# Patient Record
Sex: Male | Born: 1950 | Race: Black or African American | Hispanic: No
Health system: Southern US, Community
[De-identification: ages and names within clinical notes are randomized; demographics above are authoritative.]

---

## 2005-04-22 ENCOUNTER — Ambulatory Visit: Payer: Self-pay

## 2008-08-23 ENCOUNTER — Ambulatory Visit: Payer: Self-pay

## 2009-07-19 ENCOUNTER — Emergency Department: Payer: Self-pay | Admitting: Emergency Medicine

## 2010-01-25 ENCOUNTER — Inpatient Hospital Stay: Payer: Self-pay | Admitting: Internal Medicine

## 2010-08-14 ENCOUNTER — Ambulatory Visit: Payer: Self-pay | Admitting: Pain Medicine

## 2010-08-20 ENCOUNTER — Ambulatory Visit: Payer: Self-pay | Admitting: Pain Medicine

## 2010-08-29 ENCOUNTER — Ambulatory Visit: Payer: Self-pay | Admitting: Pain Medicine

## 2010-09-03 ENCOUNTER — Ambulatory Visit: Payer: Self-pay | Admitting: Family Medicine

## 2010-09-05 ENCOUNTER — Ambulatory Visit: Payer: Self-pay | Admitting: Pain Medicine

## 2010-10-02 ENCOUNTER — Ambulatory Visit: Payer: Self-pay | Admitting: Family Medicine

## 2010-10-18 ENCOUNTER — Ambulatory Visit: Payer: Self-pay | Admitting: Nephrology

## 2010-10-29 ENCOUNTER — Ambulatory Visit: Payer: Self-pay | Admitting: Family Medicine

## 2010-11-28 ENCOUNTER — Ambulatory Visit: Payer: Self-pay | Admitting: Family Medicine

## 2011-01-28 ENCOUNTER — Inpatient Hospital Stay: Payer: Self-pay | Admitting: Internal Medicine

## 2011-03-20 IMAGING — US US RENAL KIDNEY
1 series · 17 of 23 positions shown · non-contrast
Comparison: none

REASON FOR EXAM: CKD III
COMMENTS:

[Series 1: us renal kidney · 17 of 23 slices shown]
[im 1/23]
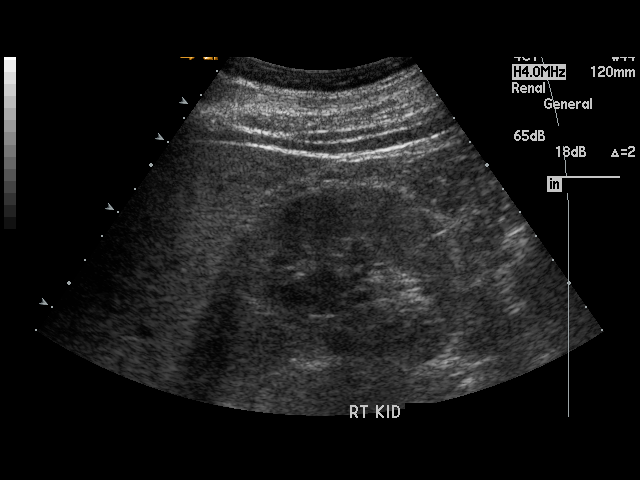
[im 3/23]
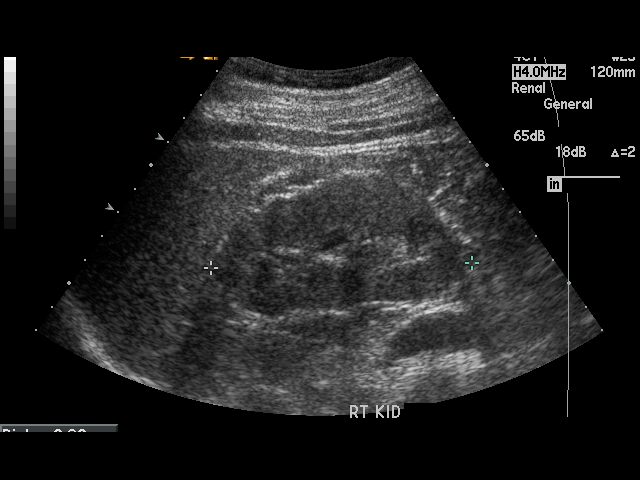
[im 4/23]
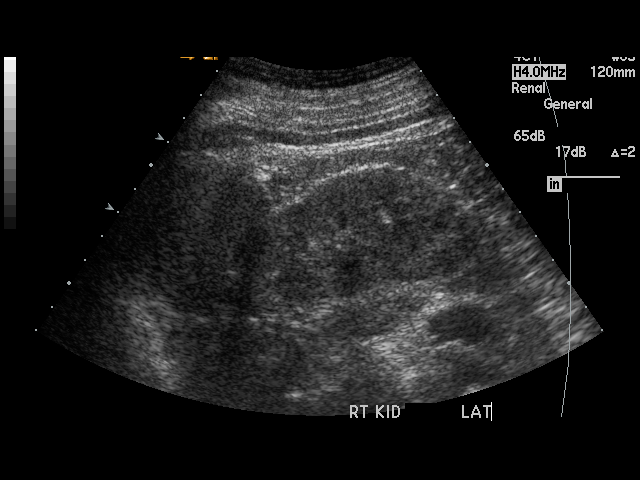
[im 5/23]
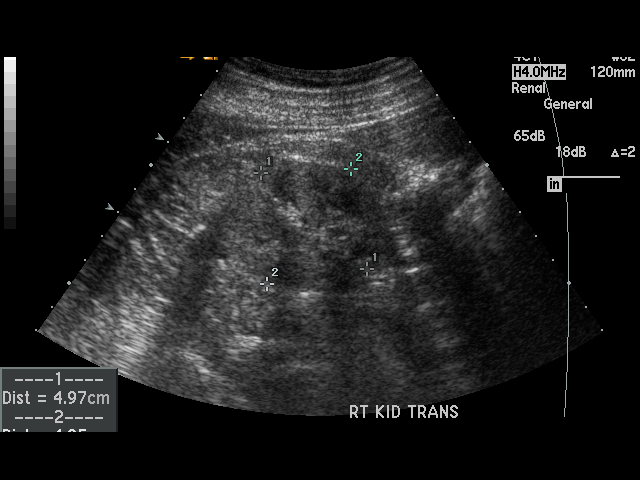
[im 7/23]
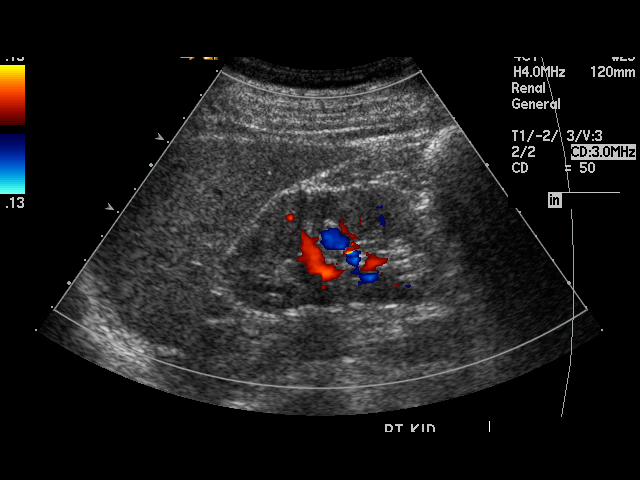
[im 8/23]
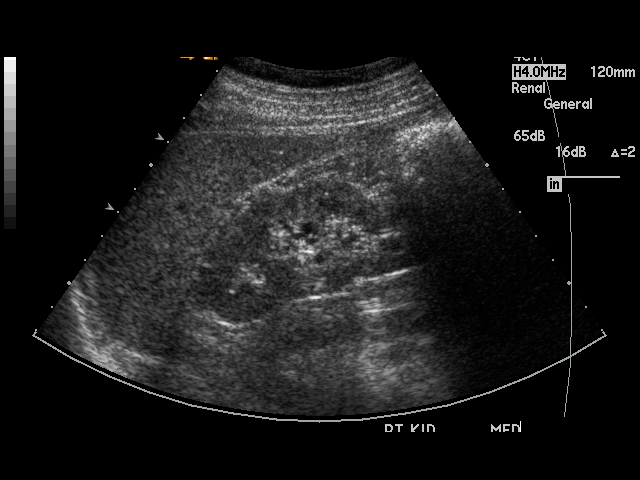
[im 9/23]
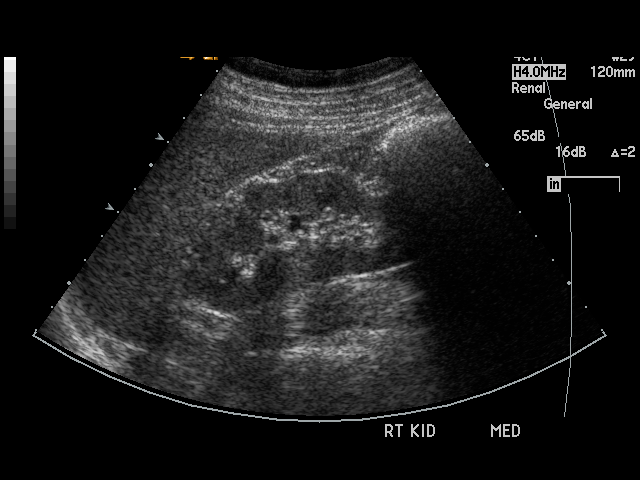
[im 11/23]
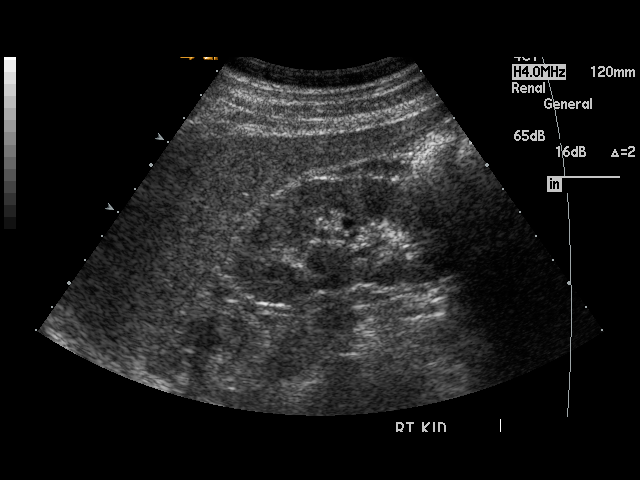
[im 12/23]
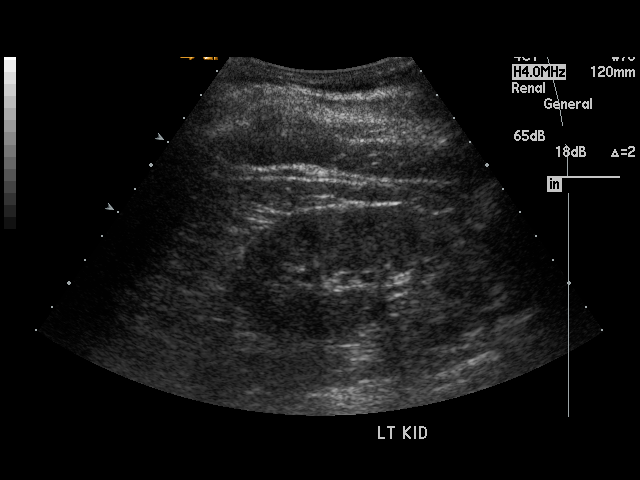
[im 13/23]
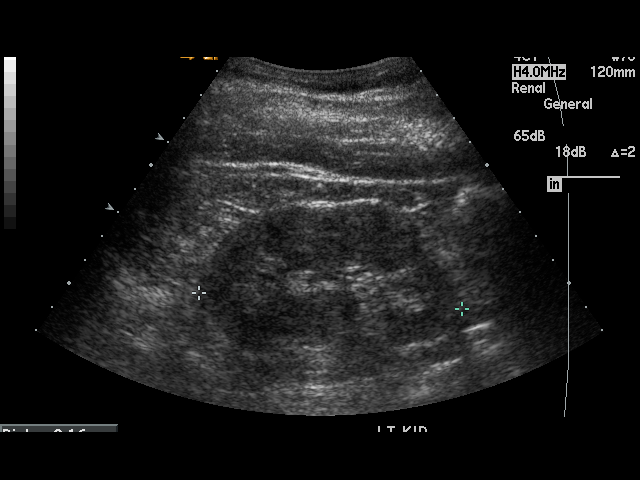
[im 15/23]
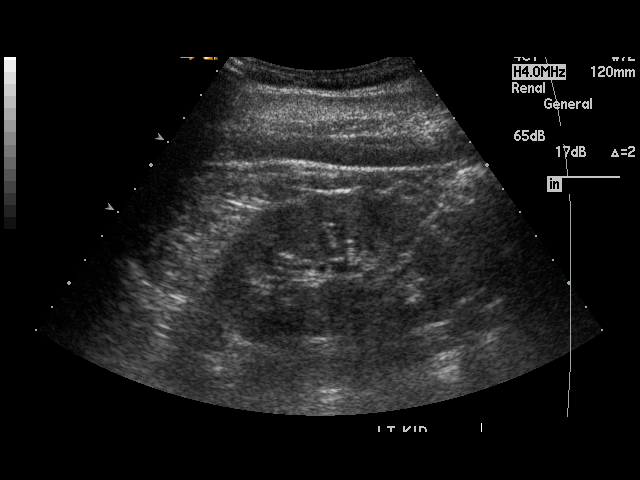
[im 16/23]
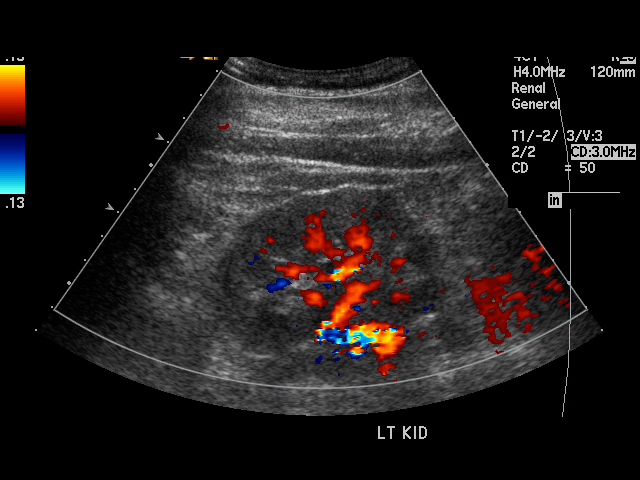
[im 17/23]
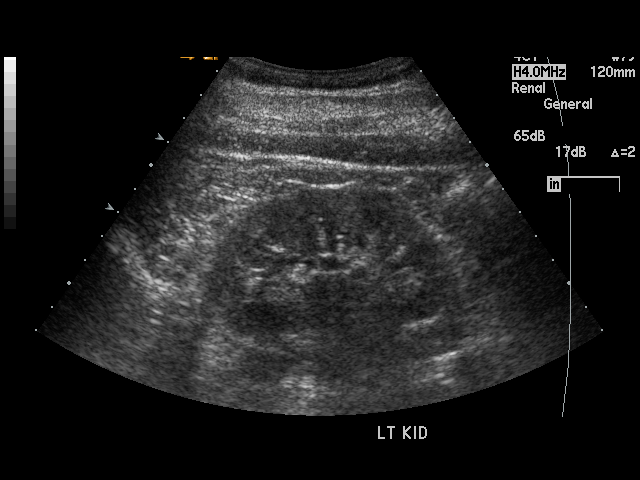
[im 19/23]
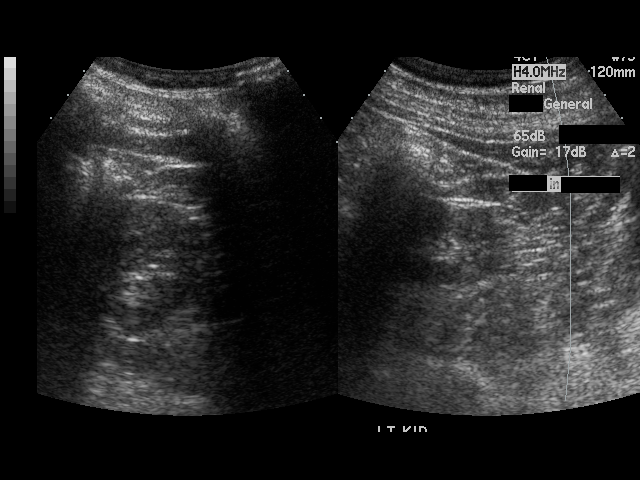
[im 20/23]
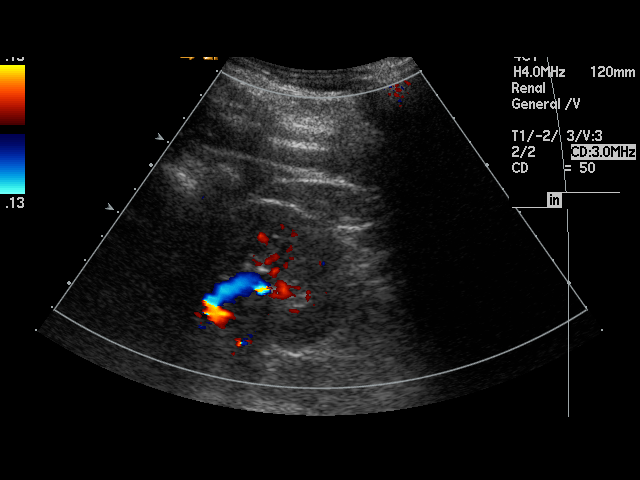
[im 21/23]
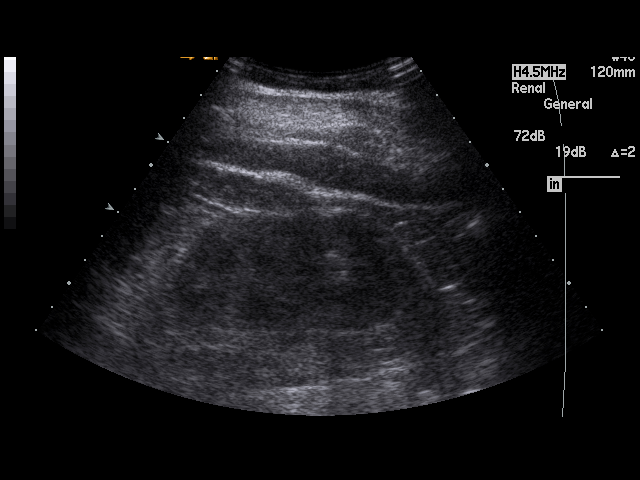
[im 23/23]
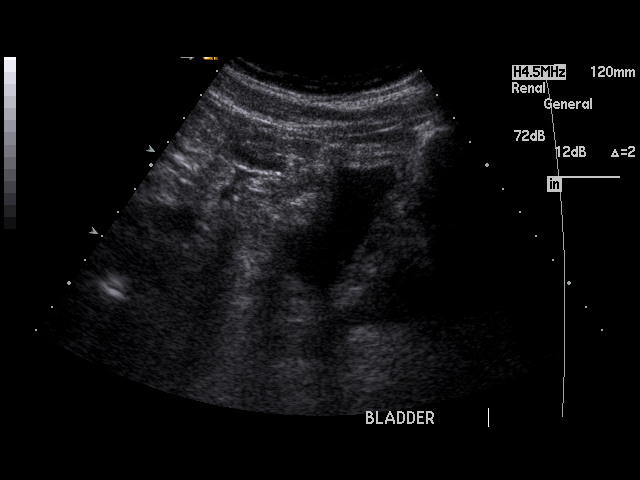

[17 of 23 positions shown; findings below may reference images not displayed]

PROCEDURE:     RONNETTE - RONNETTE KIDNEYS  - October 18, 2010  [DATE]

RESULT:     The right kidney measures 9.08 cm x 4.9 cm x 4.9 cm and the left
kidney measures 9.1 cm x 4.6 cm x 4.4 cm. The renal cortical margins
basically are smooth bilaterally. The kidneys bilaterally show increased
echogenicity consistent with a history of decreased renal function. No solid
or cystic renal mass lesions are seen. There is no hydronephrosis. No renal
calcifications are noted. The urinary bladder is nearly empty but the
visualized portion of the nearly empty bladder shows no significant
abnormalities.
IMPRESSION: 1. The kidneys show increased echogenicity consistent with a history of
diminished renal function.
2. No hydronephrosis or other acute change is identified.

## 2011-07-01 IMAGING — US US RENAL KIDNEY
1 series · 17 of 25 positions shown · non-contrast
Comparison: none

REASON FOR EXAM: renal failure, r/o obstruction
COMMENTS:

[Series 1: us renal kidney · 17 of 29 slices shown]
[im 1/29]
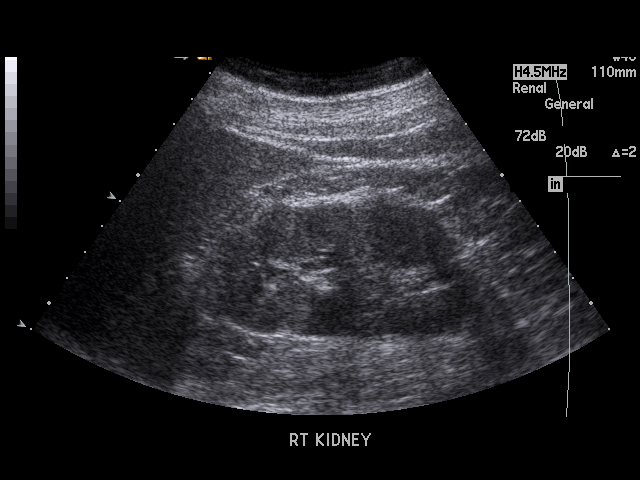
[im 3/29]
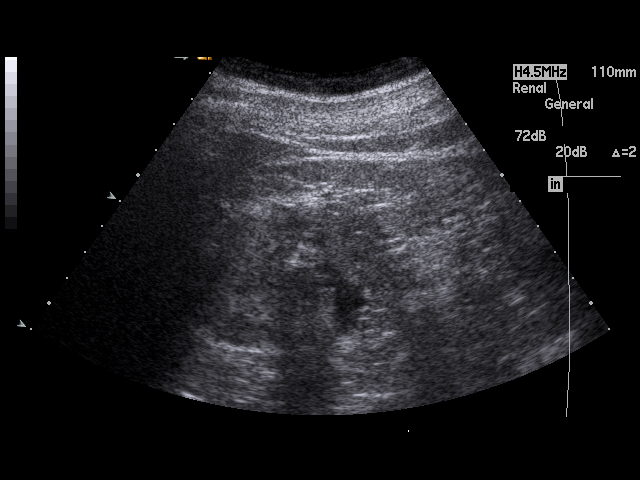
[im 4/29]
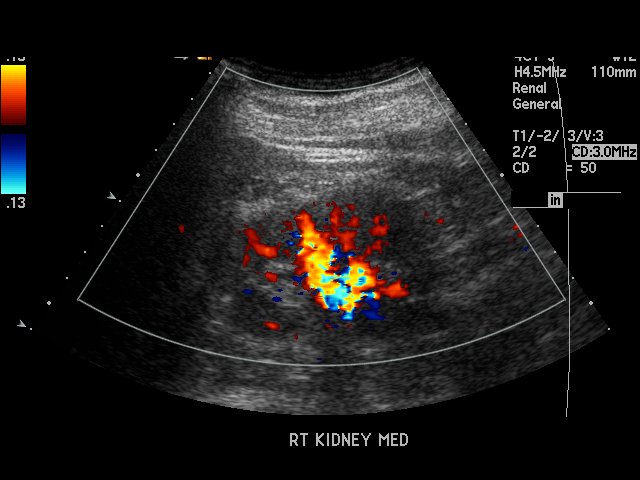
[im 6/29]
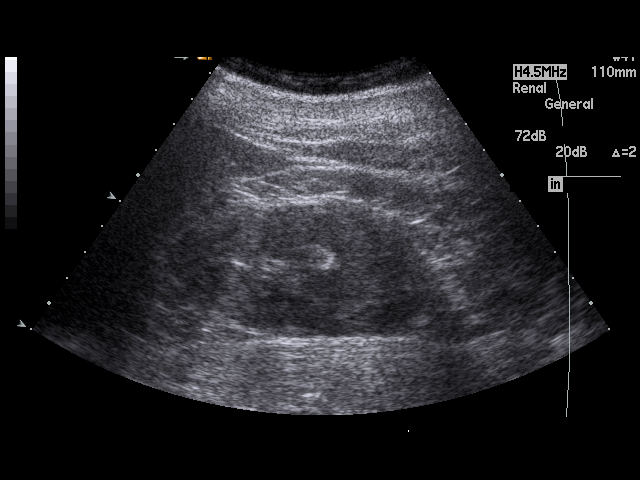
[im 8/29]
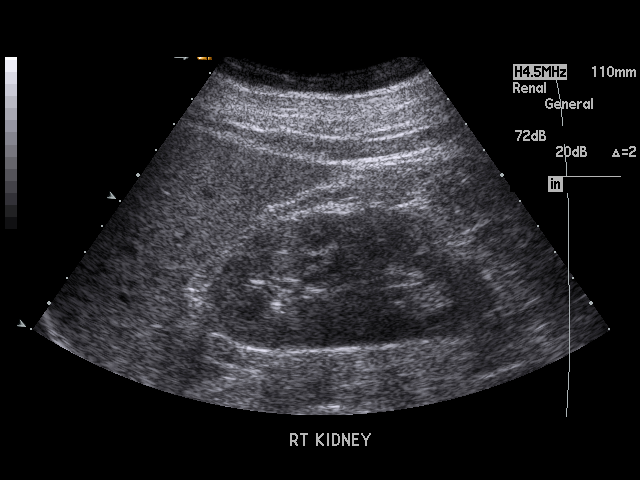
[im 10/29]
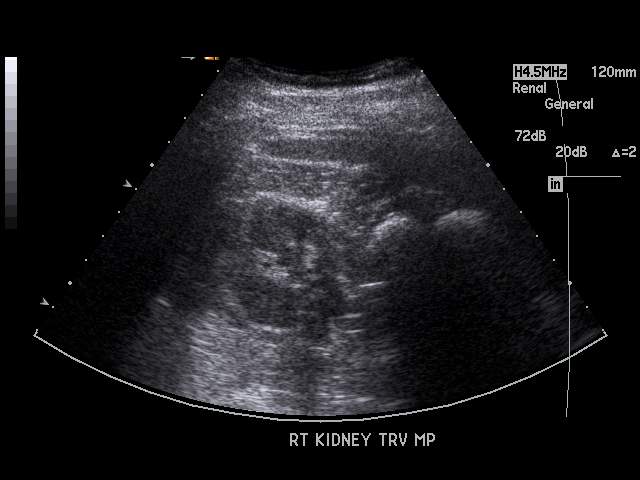
[im 11/29]
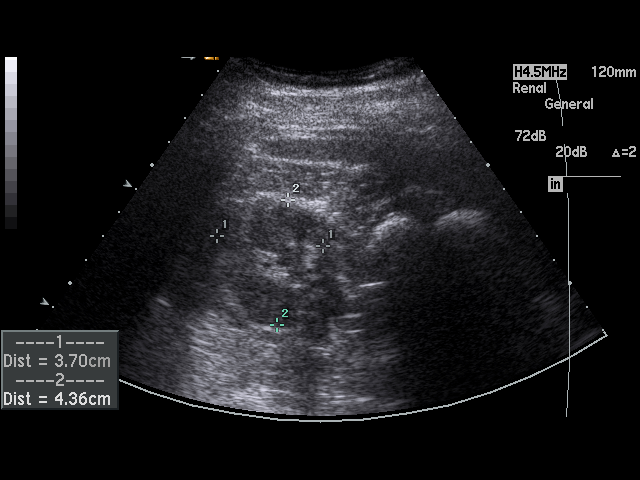
[im 13/29]
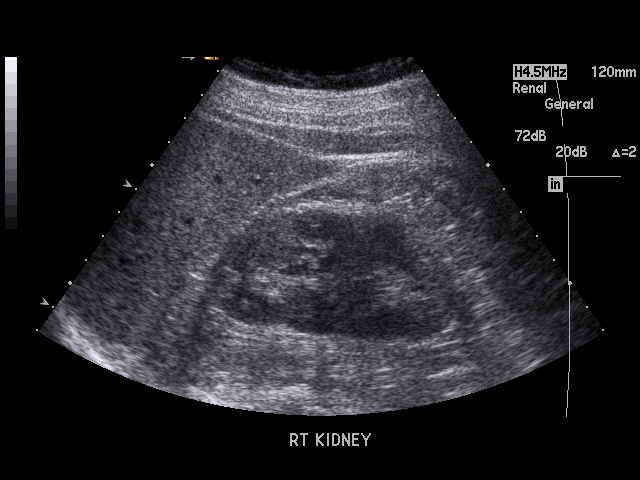
[im 15/29]
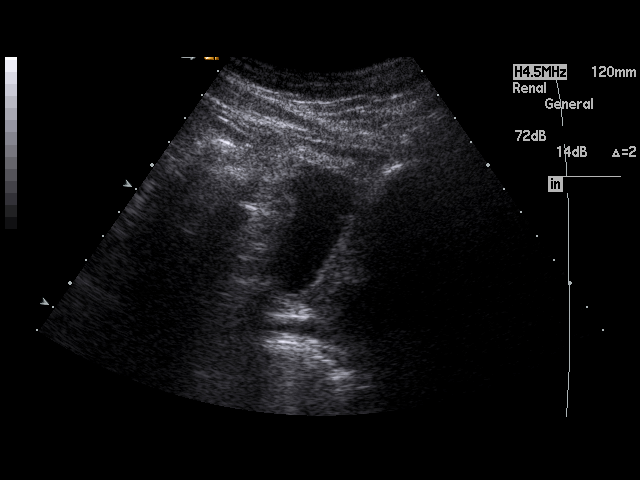
[im 16/29]
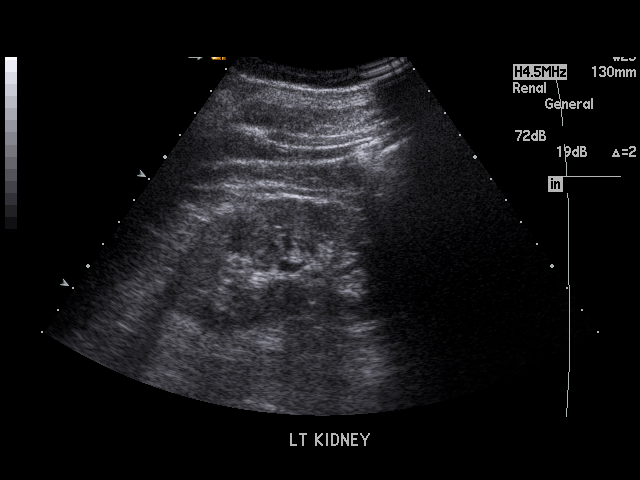
[im 18/29]
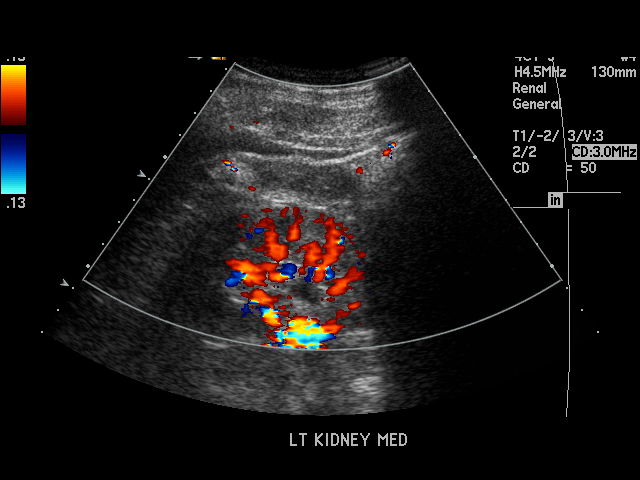
[im 19/29]
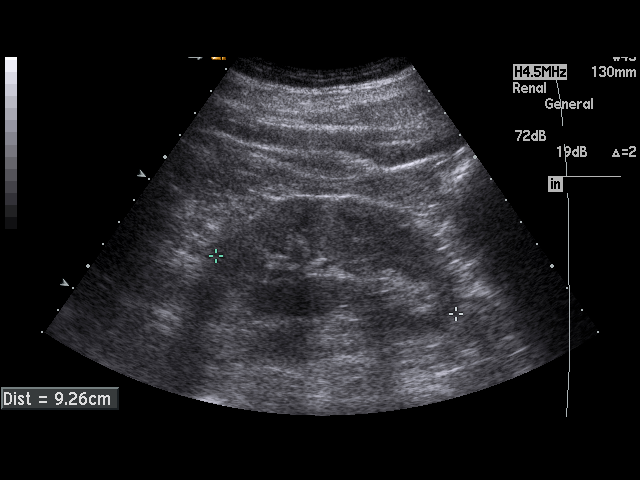
[im 22/29]
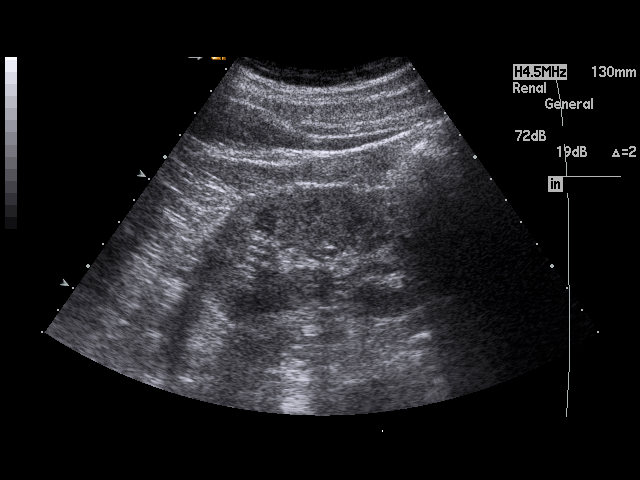
[im 23/29]
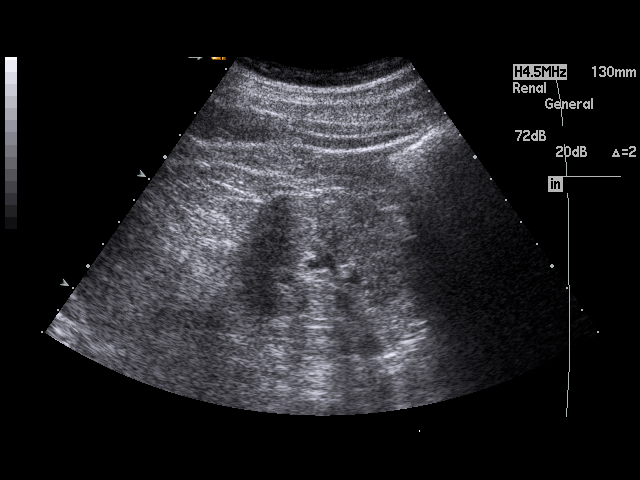
[im 25/29]
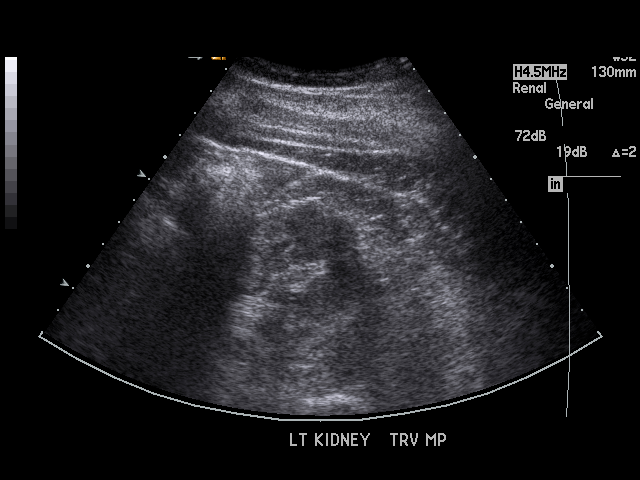
[im 26/29]
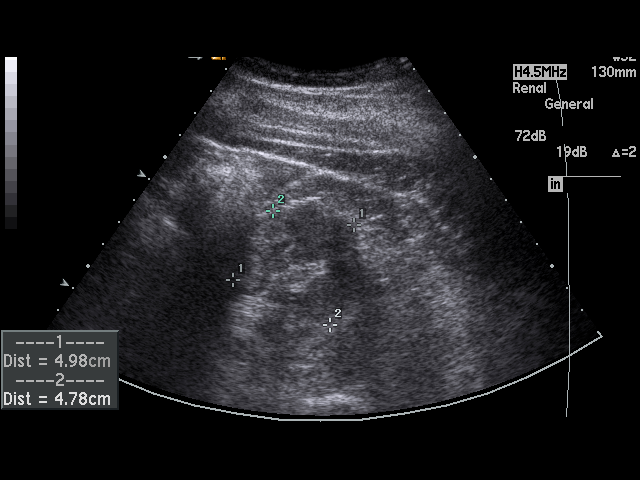
[im 29/29]
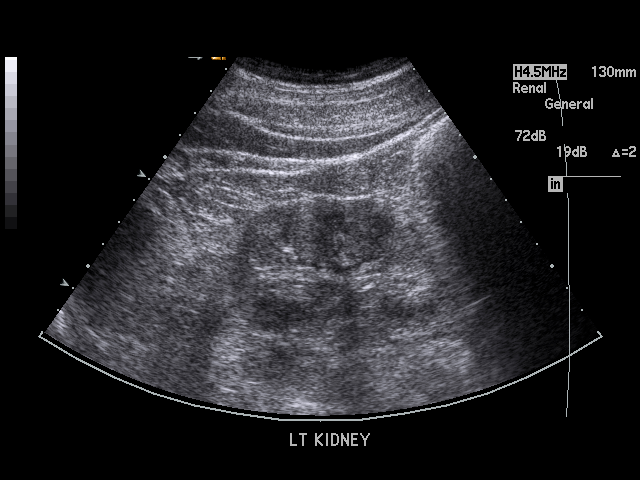

[17 of 25 positions shown; findings below may reference images not displayed]

PROCEDURE:     US  - US KIDNEY  - January 29, 2011  [DATE]

RESULT:     The right kidney measures 9.07 cm x 3.7 cm x 4.36 cm and the
left kidney measures 9.26 cm x 4.98 cm x 4.78 cm. The renal cortical margins
are smooth. The kidneys are mildly hyperechogenic bilaterally. No solid or
cystic renal mass lesions are seen. No renal calcifications are noted. There
is no hydronephrosis. The urinary bladder is not sufficiently filled for
evaluation.
IMPRESSION: 1. No hydronephrosis or other acute change is identified.
2. The kidneys again show increased echogenicity compatible with the
clinical history of diminished renal function.

## 2012-11-26 ENCOUNTER — Emergency Department: Payer: Self-pay | Admitting: Emergency Medicine

## 2014-12-01 LAB — CBC WITH DIFFERENTIAL/PLATELET
BASOS PCT: 0.4 %
Basophil #: 0 10*3/uL (ref 0.0–0.1)
Eosinophil #: 0.2 10*3/uL (ref 0.0–0.7)
Eosinophil %: 1.6 %
HCT: 39.4 % — AB (ref 40.0–52.0)
HGB: 12.6 g/dL — ABNORMAL LOW (ref 13.0–18.0)
LYMPHS ABS: 3.6 10*3/uL (ref 1.0–3.6)
LYMPHS PCT: 34.4 %
MCH: 31.3 pg (ref 26.0–34.0)
MCHC: 32 g/dL (ref 32.0–36.0)
MCV: 98 fL (ref 80–100)
Monocyte #: 0.7 x10 3/mm (ref 0.2–1.0)
Monocyte %: 6.9 %
NEUTROS ABS: 6 10*3/uL (ref 1.4–6.5)
Neutrophil %: 56.7 %
PLATELETS: 196 10*3/uL (ref 150–440)
RBC: 4.03 10*6/uL — ABNORMAL LOW (ref 4.40–5.90)
RDW: 13.5 % (ref 11.5–14.5)
WBC: 10.6 10*3/uL (ref 3.8–10.6)

## 2014-12-01 LAB — COMPREHENSIVE METABOLIC PANEL
ALBUMIN: 2.9 g/dL — AB (ref 3.4–5.0)
ALK PHOS: 119 U/L — AB
ANION GAP: 11 (ref 7–16)
AST: 56 U/L — AB (ref 15–37)
BILIRUBIN TOTAL: 0.4 mg/dL (ref 0.2–1.0)
BUN: 35 mg/dL — ABNORMAL HIGH (ref 7–18)
CHLORIDE: 109 mmol/L — AB (ref 98–107)
Calcium, Total: 8.6 mg/dL (ref 8.5–10.1)
Co2: 22 mmol/L (ref 21–32)
Creatinine: 3.45 mg/dL — ABNORMAL HIGH (ref 0.60–1.30)
EGFR (African American): 23 — ABNORMAL LOW
EGFR (Non-African Amer.): 19 — ABNORMAL LOW
Glucose: 145 mg/dL — ABNORMAL HIGH (ref 65–99)
OSMOLALITY: 294 (ref 275–301)
Potassium: 3.8 mmol/L (ref 3.5–5.1)
SGPT (ALT): 49 U/L
Sodium: 142 mmol/L (ref 136–145)
TOTAL PROTEIN: 7.4 g/dL (ref 6.4–8.2)

## 2014-12-01 LAB — PROTIME-INR
INR: 0.9
PROTHROMBIN TIME: 12.5 s (ref 11.5–14.7)

## 2014-12-01 LAB — URINALYSIS, COMPLETE
BACTERIA: NONE SEEN
Bilirubin,UR: NEGATIVE
GLUCOSE, UR: NEGATIVE mg/dL (ref 0–75)
KETONE: NEGATIVE
LEUKOCYTE ESTERASE: NEGATIVE
NITRITE: NEGATIVE
PH: 5 (ref 4.5–8.0)
Protein: 100
Specific Gravity: 1.009 (ref 1.003–1.030)
Squamous Epithelial: <1
WBC UR: 2 /HPF (ref 0–5)

## 2014-12-01 LAB — CK TOTAL AND CKMB (NOT AT ARMC)
CK, TOTAL: 265 U/L (ref 39–308)
CK-MB: 6 ng/mL — AB (ref 0.5–3.6)

## 2014-12-02 ENCOUNTER — Inpatient Hospital Stay: Payer: Self-pay | Admitting: Internal Medicine

## 2014-12-02 LAB — CK-MB
CK-MB: 6.5 ng/mL — ABNORMAL HIGH (ref 0.5–3.6)
CK-MB: 6.6 ng/mL — ABNORMAL HIGH (ref 0.5–3.6)

## 2014-12-02 LAB — TROPONIN I
Troponin-I: 0.06 ng/mL — ABNORMAL HIGH
Troponin-I: 0.17 ng/mL — ABNORMAL HIGH
Troponin-I: 0.11 ng/mL — ABNORMAL HIGH

## 2014-12-02 LAB — PRO B NATRIURETIC PEPTIDE: B-TYPE NATIURETIC PEPTID: 50322 pg/mL — AB (ref 0–125)

## 2014-12-03 LAB — LIPID PANEL
Cholesterol: 136 mg/dL (ref 0–200)
HDL Cholesterol: 52 mg/dL (ref 40–60)
Ldl Cholesterol, Calc: 64 mg/dL (ref 0–100)
Triglycerides: 101 mg/dL (ref 0–200)
VLDL Cholesterol, Calc: 20 mg/dL (ref 5–40)

## 2014-12-03 LAB — BASIC METABOLIC PANEL
Anion Gap: 13 (ref 7–16)
BUN: 62 mg/dL — ABNORMAL HIGH (ref 7–18)
Calcium, Total: 8.4 mg/dL — ABNORMAL LOW (ref 8.5–10.1)
Chloride: 103 mmol/L (ref 98–107)
Co2: 22 mmol/L (ref 21–32)
Creatinine: 3.67 mg/dL — ABNORMAL HIGH (ref 0.60–1.30)
EGFR (African American): 22 — ABNORMAL LOW
EGFR (Non-African Amer.): 18 — ABNORMAL LOW
Glucose: 131 mg/dL — ABNORMAL HIGH (ref 65–99)
Osmolality: 295 (ref 275–301)
Potassium: 3.9 mmol/L (ref 3.5–5.1)
Sodium: 138 mmol/L (ref 136–145)

## 2014-12-03 LAB — HEMOGLOBIN A1C: Hemoglobin A1C: 6.4 % — ABNORMAL HIGH (ref 4.2–6.3)

## 2014-12-03 LAB — TSH: Thyroid Stimulating Horm: 0.337 u[IU]/mL — ABNORMAL LOW

## 2014-12-04 LAB — BASIC METABOLIC PANEL
Anion Gap: 10 (ref 7–16)
BUN: 63 mg/dL — AB (ref 7–18)
CHLORIDE: 107 mmol/L (ref 98–107)
Calcium, Total: 8.5 mg/dL (ref 8.5–10.1)
Co2: 23 mmol/L (ref 21–32)
Creatinine: 3.52 mg/dL — ABNORMAL HIGH (ref 0.60–1.30)
GFR CALC AF AMER: 23 — AB
GFR CALC NON AF AMER: 19 — AB
Glucose: 94 mg/dL (ref 65–99)
OSMOLALITY: 297 (ref 275–301)
POTASSIUM: 3.8 mmol/L (ref 3.5–5.1)
SODIUM: 140 mmol/L (ref 136–145)

## 2014-12-04 LAB — PLATELET COUNT: PLATELETS: 191 10*3/uL (ref 150–440)

## 2015-04-17 ENCOUNTER — Observation Stay
Admit: 2015-04-17 | Disposition: A | Discharge status: Home / Self Care | Payer: Self-pay | Attending: Internal Medicine | Admitting: Internal Medicine

## 2015-04-17 LAB — COMPREHENSIVE METABOLIC PANEL
ALT: 25 U/L
AST: 32 U/L
Albumin: 3.5 g/dL
Alkaline Phosphatase: 72 U/L
Anion Gap: 9 (ref 7–16)
BILIRUBIN TOTAL: 0.5 mg/dL
BUN: 46 mg/dL — ABNORMAL HIGH
CHLORIDE: 105 mmol/L
CO2: 27 mmol/L
CREATININE: 3.7 mg/dL — AB
Calcium, Total: 8.9 mg/dL
EGFR (African American): 19 — ABNORMAL LOW
GFR CALC NON AF AMER: 16 — AB
Glucose: 102 mg/dL — ABNORMAL HIGH
Potassium: 4.1 mmol/L
Sodium: 141 mmol/L
Total Protein: 7.2 g/dL

## 2015-04-17 LAB — CBC WITH DIFFERENTIAL/PLATELET
BASOS ABS: 0 10*3/uL (ref 0.0–0.1)
Basophil %: 0.6 %
EOS PCT: 1.7 %
Eosinophil #: 0.1 10*3/uL (ref 0.0–0.7)
HCT: 33 % — ABNORMAL LOW (ref 40.0–52.0)
HGB: 10.9 g/dL — ABNORMAL LOW (ref 13.0–18.0)
LYMPHS ABS: 1.5 10*3/uL (ref 1.0–3.6)
Lymphocyte %: 30.2 %
MCH: 30.8 pg (ref 26.0–34.0)
MCHC: 33 g/dL (ref 32.0–36.0)
MCV: 93 fL (ref 80–100)
MONO ABS: 0.5 x10 3/mm (ref 0.2–1.0)
MONOS PCT: 10.8 %
Neutrophil #: 2.8 10*3/uL (ref 1.4–6.5)
Neutrophil %: 56.7 %
PLATELETS: 191 10*3/uL (ref 150–440)
RBC: 3.54 10*6/uL — AB (ref 4.40–5.90)
RDW: 14.5 % (ref 11.5–14.5)
WBC: 5 10*3/uL (ref 3.8–10.6)

## 2015-04-17 LAB — IRON AND TIBC
IRON BIND. CAP.(TOTAL): 376 (ref 250–450)
IRON SATURATION: 12.8
IRON: 48 ug/dL
Unbound Iron-Bind.Cap.: 328.2

## 2015-04-17 LAB — HEMOGLOBIN A1C: Hemoglobin A1C: 5.9 %

## 2015-04-17 LAB — FERRITIN: FERRITIN (ARMC): 36 ng/mL

## 2015-04-18 LAB — CBC WITH DIFFERENTIAL/PLATELET
Basophil #: 0 10*3/uL (ref 0.0–0.1)
Basophil %: 0.6 %
Eosinophil #: 0.1 10*3/uL (ref 0.0–0.7)
Eosinophil %: 2.4 %
HCT: 30.1 % — AB (ref 40.0–52.0)
HGB: 10.1 g/dL — ABNORMAL LOW (ref 13.0–18.0)
LYMPHS PCT: 37.6 %
Lymphocyte #: 1.7 10*3/uL (ref 1.0–3.6)
MCH: 31 pg (ref 26.0–34.0)
MCHC: 33.5 g/dL (ref 32.0–36.0)
MCV: 93 fL (ref 80–100)
Monocyte #: 0.5 x10 3/mm (ref 0.2–1.0)
Monocyte %: 11.7 %
NEUTROS ABS: 2.2 10*3/uL (ref 1.4–6.5)
Neutrophil %: 47.7 %
Platelet: 180 10*3/uL (ref 150–440)
RBC: 3.25 10*6/uL — ABNORMAL LOW (ref 4.40–5.90)
RDW: 14.5 % (ref 11.5–14.5)
WBC: 4.5 10*3/uL (ref 3.8–10.6)

## 2015-04-18 LAB — BASIC METABOLIC PANEL
ANION GAP: 7 (ref 7–16)
BUN: 48 mg/dL — AB
Calcium, Total: 8.6 mg/dL — ABNORMAL LOW
Chloride: 108 mmol/L
Co2: 24 mmol/L
Creatinine: 3.54 mg/dL — ABNORMAL HIGH
EGFR (African American): 20 — ABNORMAL LOW
GFR CALC NON AF AMER: 17 — AB
GLUCOSE: 117 mg/dL — AB
Potassium: 3.9 mmol/L
Sodium: 139 mmol/L

## 2015-04-18 LAB — OCCULT BLOOD X 1 CARD TO LAB, STOOL: OCCULT BLOOD, FECES: NEGATIVE

## 2015-04-19 LAB — CBC WITH DIFFERENTIAL/PLATELET
Basophil #: 0 10*3/uL (ref 0.0–0.1)
Basophil %: 0.7 %
Eosinophil #: 0.1 10*3/uL (ref 0.0–0.7)
Eosinophil %: 2 %
HCT: 32.5 % — ABNORMAL LOW (ref 40.0–52.0)
HGB: 10.6 g/dL — ABNORMAL LOW (ref 13.0–18.0)
LYMPHS PCT: 26.4 %
Lymphocyte #: 1.2 10*3/uL (ref 1.0–3.6)
MCH: 30.3 pg (ref 26.0–34.0)
MCHC: 32.5 g/dL (ref 32.0–36.0)
MCV: 93 fL (ref 80–100)
MONO ABS: 0.4 x10 3/mm (ref 0.2–1.0)
Monocyte %: 9.6 %
Neutrophil #: 2.8 10*3/uL (ref 1.4–6.5)
Neutrophil %: 61.3 %
Platelet: 210 10*3/uL (ref 150–440)
RBC: 3.48 10*6/uL — ABNORMAL LOW (ref 4.40–5.90)
RDW: 14.8 % — AB (ref 11.5–14.5)
WBC: 4.6 10*3/uL (ref 3.8–10.6)

## 2015-04-21 NOTE — H&P (Signed)
PATIENT NAME:  Austin Haney, Austin Haney MR#:  [id number] DATE OF BIRTH:  01-31-1951  DATE OF ADMISSION:  12/02/2014  REFERRING PHYSICIAN: Enedina Finner. Manson Passey, MD  PRIMARY CARE PHYSICIAN: Silas Flood. Tejan-Sie, MD  ADMISSION DIAGNOSES:  Malignant hypertension and acute respiratory failure with hypoxia secondary to pulmonary edema.   HISTORY OF PRESENT ILLNESS: This is a 64 year old African American male who presents to the Emergency Department via EMS complaining of shortness of breath. The patient states that his breathing had been bad intermittently for a few weeks, but acutely worsened on the day of admission. He was not participating in any strenuous activity. He denied any sort of stabbing or gripping chest pain at the onset of his shortness of breath, but admits to some chest pain when taking deep breaths. The pain is substernal and radiates across his chest; it does not radiate down his arm or into his jaw. He has not had any nausea or vomiting, lightheadedness, or dizziness associated with this pain. He admits to a headache earlier today. He has a history of 2 pillow orthopnea at baseline, and he has not had to alter this since this acute episode. His labored breathing is what was most concerning to him.   In the Emergency Department, he was placed on BiPAP and was feeling much more comfortable. However, continued to have an incredibly elevated blood pressure with systolics more than 200, which prompted the Emergency Department to call for admission.   REVIEW OF SYSTEMS:  CONSTITUTIONAL: The patient denies fever or weakness.  EYES: Denies inflammation or blurred vision.  EARS, NOSE AND THROAT: Denies tinnitus or sore throat.  RESPIRATORY: Admits to shortness of breath and some occasional coughing, as well as dyspnea on exertion.  CARDIOVASCULAR: Admits to chest pain that is associated with his deep breathing. He denies palpitations. He admits to a chronic history of orthopnea.  GASTROINTESTINAL: The  patient denies nausea, vomiting, abdominal pain, or diarrhea.  GENITOURINARY: Denies dysuria, increased frequency or hesitancy of urination.  ENDOCRINE: Denies polyuria or polydipsia.  INTEGUMENTARY: Denies rashes or lesions.  MUSCULOSKELETAL: Denies myalgias or arthralgias.  NEUROLOGIC: Denies numbness in his extremities or difficulty speaking.  PSYCHIATRIC: Denies depression or suicidal ideation.   PAST MEDICAL HISTORY: Hypertension, diabetes type 2, chronic back pain, chronic kidney disease, peptic ulcer disease, and gastroesophageal reflux disease.   PAST SURGICAL HISTORY: The patient had ORIF of his right arm many years ago.   SOCIAL HISTORY: The patient is a 64 pack-year smoker. He quit drinking alcohol 20 years ago, but he admits to occasional marijuana use.   FAMILY HISTORY: Significant for hypertension in his father.   MEDICATIONS:  1.  Aspirin 81 mg 1 tablet p.o. daily.  2.  Atenolol 100 mg 1 tablet p.o. b.i.d.  3.  Atorvastatin 20 mg 1 tablet p.o. at bedtime.  4.  Clonidine 0.3 mg 1 tablet p.o. t.i.d.  5.  Hydralazine 100 mg 1 tablet p.o. q.i.d.  6.  Labetalol 300 mg 1 tablet p.o. t.i.d.  7.  Lisinopril 40 mg 1 tablet p.o. daily.  8.  Multivitamin 1 tablet p.o. daily.  9.  Norco 10 mg/325 mg 1 tablet p.o. daily as needed.  10.  Omeprazole 20 mg 1 tablet p.o. daily.  11.  Tradjenta 5 mg 1 tablet p.o. daily.  12.  Verapamil 240 mg per 24-hour extended release capsule 1 capsule p.o. daily.   ALLERGIES: No known drug allergies.   PERTINENT LABORATORY RESULTS AND RADIOGRAPHIC FINDINGS: B-type natriuretic peptide is 50,322.  Serum glucose is 145, BUN is 35, creatinine is 3.45, serum sodium 142, potassium 3.8, chloride is 109, serum bicarbonate 22, calcium is 8.6. Serum albumin is 2.9, alkaline phosphatase 119, AST 66, ALT is 49. Initial troponin was 0.06. White blood cell count 10.6, hemoglobin is 12.6, hematocrit is 39.4, MCV is 98. INR 0.9. Urinalysis is negative for infection.  Chest x-ray shows patchy bilateral lower lobe opacities, suspicious for pneumonia. My read of his exam is that he appears to have some mild interstitial edema, but no areas of consolidation that would be consistent with pneumonia.   PHYSICAL EXAMINATION:  VITAL SIGNS: Temperature is 97.5, pulse 89, respirations 17, blood pressure originally 230/170; at the time of this dictation, 185/134.  Pulse oximetry is 92% on 2 L of oxygen via BiPAP.  GENERAL: The patient is alert and oriented, in no apparent distress, as he feels much more comfortable with the BiPAP on.  HEENT: Normocephalic, atraumatic. Pupils equal, round, and reactive to light and accommodation. Extraocular movements are intact. Mucous membranes are slightly tacky, now that he has BiPAP on his face.  NECK: Trachea is midline. No adenopathy.  CHEST: Symmetric and atraumatic.  CARDIOVASCULAR: Tachycardic rhythm. Normal S1 and S2. No rubs, clicks, or murmurs appreciated.  LUNGS: Clear to auscultation bilaterally. The patient is wearing BiPAP at this time.  ABDOMEN: Positive bowel sounds. Soft, nontender, nondistended. No hepatosplenomegaly.  GENITOURINARY: Deferred.  MUSCULOSKELETAL: The patient moves all 4 extremities equally. There is 5/5 strength in the upper and lower extremities bilaterally.  SKIN: No rashes or lesions.   EXTREMITIES: No clubbing, cyanosis, or edema.  NEUROLOGIC: Cranial nerves II through XII are grossly intact.  PSYCHIATRIC: Mood is normal. Affect is congruent.   ASSESSMENT AND PLAN: This is a [age] year old male admitted for malignant hypertension and acute respiratory failure with hypoxia secondary to pulmonary edema.  1.   Malignant hypertension. Systolic blood pressure more than 200 on arrival, and this persisted despite a nitro drip, labetalol and Lasix given in the Emergency Department. I have discontinued the nitroglycerin drip and started nicardipine. We will give the patient nighttime doses of his home  antihypertensive medicines, as he has not yet taken those today. We will titrate his drip as necessary, and continue to monitor his blood pressure. The patient also has nitro paste in place on his chest.  2.  Acute respiratory failure with hypoxia. This is secondary to pulmonary edema, likely due to uncontrolled blood pressure, causing an acute decline in his cardiac output. The patient was given Lasix in the Emergency Department, and placed on BiPAP. The Lasix appears to have helped, and we will discontinue the BiPAP when the patient is able to maintain his oxygen saturations without it.  3.  Hypertension. The patient is on all categories of antihypertensive medicines, except for renin inhibitors. We will continue his home regimen, which are at maximum doses of his medications. Our goal is to titrate his nicardipine drip for at least reduction of 20% of his maximum blood pressure.  4.  Diabetes type 2. Continue to Tradjenta. (There is no renal adjustment necessary for renal failure.) We will add sliding scale insulin as needed.  5.  Chronic kidney disease. The patient is currently in stage IV kidney failure. His baseline is unknown. We will avoid nephrotoxic agents as much as possible, and I have ordered a  nephrology consult to comment on his blood pressure and renal function.  6.  Peptic ulcer disease. We will continue his proton pump inhibitor.  7.  Gastroesophageal reflux disease,  as above.  8.  Chronic back pain; Norco per his home regimen.  9.  Deep vein thrombosis prophylaxis; heparin.  10.  Gastrointestinal prophylaxis, as above.   CODE STATUS: The patient is a Full Code.   TIME SPENT ON ADMISSION ORDERS AND PATIENT CARE: Approximately 45 minutes.    ____________________________ Kelton Pillar. Sheryle Hail, MD msd:MT D: 12/02/2014 08:43:05 ET T: 12/02/2014 09:29:03 ET JOB#: [id number]  cc: Kelton Pillar. Sheryle Hail, MD, <Dictator> Kelton Pillar Lashona Schaaf MD ELECTRONICALLY SIGNED 12/03/2014 4:52

## 2015-04-21 NOTE — Discharge Summary (Signed)
PATIENT NAME:  Austin Haney, Austin Haney MR#:  [id number] DATE OF BIRTH:  1951/06/18  DATE OF ADMISSION:  12/02/2014 DATE OF DISCHARGE:  12/04/2014  PRIMARY CARE PHYSICIAN: Marland McalpineSheikh A. Ellsworth Lennoxejan-Sie, MD  CONSULTATION: Cardiology, Dr. Welton FlakesKhan. Nephrology, Dr. Cherylann RatelLateef and Dr. Wynelle LinkKolluru.   DISCHARGE DIAGNOSES:  1. Malignant hypertension.  2. Acute respiratory failure with hypoxia secondary to pulmonary edema due to malignant hypertension.  3. Acute renal failure on chronic kidney disease.  4. Diabetes 2.   CONDITION: Stable.   CODE STATUS: Full code.   HOME MEDICATIONS: Please refer to the medication reconciliation list.   DIET: Low-sodium, low-fat, low-cholesterol, ADA diet.   ACTIVITY: As tolerated.   FOLLOW-UP CARE: Follow with PCP, Dr. Welton FlakesKhan and Dr. Cherylann RatelLateef within 1 to 2 weeks.   REASON FOR ADMISSION: Malignant hypertension, acute respiratory failure with hypoxia secondary to pulmonary edema, shortness of breath.   HOSPITAL COURSE: The patient is a 64 year old African American male with a history of hypertension, diabetes, CKD, presented to the ED with worsening shortness of breath with chest pain at the onset of his has shortness of breath. The patient has orthopnea at baseline. The patient was placed on BiPAP; however, the patient's blood pressure was elevated more than 200, so patient was admitted for hypertension.  For detailed history and physical examination, please refer to the admission note dictated by Dr. Sheryle Hailiamond.   Laboratory data on admission date showed BNP 50,322, BUN 35, creatinine 3.45. Electrolytes were normal, bicarbonate 22, WBC 10.6, hemoglobin 12.6. Urinalysis is negative. Chest x-ray showed patchy bilateral lower lobe opacity suspected for pneumonia, mild interstitial edema, but no consolidation.   For malignant hypertension, the patient was admitted to Critical Care Unit after start of nicardipine. In addition, the patient has been treated with home hypertension medication including  labetalol, atenolol, Lasix, etc. The patient's blood pressure was gradually improving. He was off the Cardizem drip yesterday. The patient was aided with minoxidil yesterday. The last blood pressure today is 158/94.   Acute respiratory failure with hypoxia and secondary to pulmonary edema, likely due to malignant hypertension. The patient was treated with BiPAP. He was off BiPAP with oxygen by nasal cannula and then weaned off oxygen. The patient only has a mild shortness of breath occasionally during the night, which is his baseline.   Acute renal failure on chronic kidney disease. The patient has been treated with Lasix. The patient's BUN increased to 63, possibly due to Lasix. Creatinine has been stable. According to Dr. Wynelle LinkKolluru, the patient's baseline creatinine was 2.9. The patient needs followup BMP and nephrology as outpatient.   Diabetes has been treated with sliding scale. Blood sugar has been controlled.   The patient has no complaints. Vital signs are stable. He is clinically stable and will be discharged to home today. I discussed patient's discharge plan with the patient, nurse, Dr. Welton FlakesKhan, and Dr. Wynelle LinkKolluru. Dr. Welton FlakesKhan suggested the patient has uncontrolled hypertension on maximal hypertension medication. The patient may need a renal artery ultrasound as outpatient in his office.   TIME SPENT: About 45 minutes.  ____________________________ Shaune PollackQing Mikaili Flippin, MD qc:ap D: 12/04/2014 11:38:42 ET T: 12/04/2014 12:24:54 ET JOB#: [id number]  cc: Shaune PollackQing Enora Trillo, MD, <Dictator> Shaune PollackQING Sterling Ucci MD ELECTRONICALLY SIGNED 12/04/2014 17:23

## 2015-04-25 NOTE — Consult Note (Signed)
PATIENT NAME:  Austin Haney, Austin Haney MR#:  [id number] DATE OF BIRTH:  Jul 18, 1951  DATE OF CONSULTATION:  12/02/2014  REFERRING PHYSICIAN:   CONSULTING PHYSICIAN:  Alinda SierrasEileen A. Malik Paar, PA-C  INDICATION FOR CONSULTATION: Malignant hypertension, CHF, acute respiratory failure.   HISTORY OF PRESENT ILLNESS: This is a 64 year old African American male well known to the practice for cardiology and internal medicine with past medical history of hypertension, diabetes mellitus, hyperlipidemia, GERD, CKD, and hepatitis C, presented to the Emergency Room last night complaining of shortness of breath x 1 week. He admits the shortness of breath increased in severity last night and also had pleuritic chest pain. He admits to concurrent fatigue, productive cough with minimal yellow sputum, pleuritic chest pain worse on inspiration, 2 pillow orthopnea, and PND when lying on back. Blood pressure was found to be in 200s systolic and needed BiPAP to control shortness of breath, thus cardiology was consulted.   PAST MEDICAL HISTORY: Hypertension, diabetes mellitus 2, hyperlipidemia, chronic kidney disease stage III, hepatitis C, GERD, and moderate aortic regurgitation.   HOME MEDICATIONS: Include clonidine 0.3 mg t.i.d., 81 mg aspirin, hydralazine 100 mg 4 times a day, hydrocodone acetaminophen 10/325 one tablet every 6 hours, labetalol 300 mg t.i.d., Lipitor 20 mg daily, lisinopril 40 mg daily, omeprazole 20 mg daily, Onglyza  2.5 mg daily, Tradjenta 5 mg once a day, verapamil 240 mg daily.   SOCIAL HISTORY: Ten cigarettes per day for 40 years. No alcohol or other illicit drug use. The patient is physically active.    ALLERGIES: No known drug allergies.   FAMILY HISTORY: Father had CVA. Brother had prostate cancer.   REVIEW OF SYSTEMS SYSTEMIC:  SYSTEMIC: No fatigue or malaise.  CARDIOVASCULAR: Chest pain is resolved. No palpitations.  PULMONARY: Shortness of breath is improved.  GASTROINTESTINAL: No abdominal pain,  heartburn, constipation, or diarrhea.   PHYSICAL EXAMINATION: VITAL SIGNS: Temperature 97.8, pulse 84, respirations 14, blood pressure 128/94, pulse oximetry 98% on 2 liters nasal cannula, although the patient did remove nasal cannula during interview and did not desaturate at all.  GENERAL: A and O x 3, in no acute distress.  LUNGS: Mild wheezes at bases bilaterally. No crackles or rhonchi appreciated.  CARDIOVASCULAR: Normal S1, S2. No audible murmur.  ABDOMEN: Soft, nontender. Positive bowel sounds.  EXTREMITIES: No pedal edema.   LABORATORY DATA: Troponin 0.6, 0.17, 0.11. Creatinine 3.45. BNP [id number],322. WBC 10.6. 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 Chest x-ray shows bilateral lower lobe opacities.   ASSESSMENT AND PLAN:  1.  Malignant hypertension: Now controlled on clonidine, labetalol, verapamil, and lisinopril. Questionable congestive heart failure, await echocardiogram results. Echocardiogram was done in office 1 year ago exactly with ejection fraction of 65%, grade 1 diastolic dysfunction, moderate aortic regurgitation.   2.  Elevated troponin: Likely secondary to demand ischemia. The patient also had a nuclear stress test in the office 1 year ago with normal results. Advise continuing aspirin and statin.   We will continue to follow. Thank you very much for this consult    ____________________________ Alinda SierrasEileen A. Margarito Courseromano, PA-C ear:at D: 12/02/2014 16:25:41 ET T: 12/02/2014 17:31:14 ET JOB#: [id number]  cc: Marjean DonnaEileen A. Margarito Courseromano, PA-C, <Dictator> Alinda SierrasEILEEN A Sheralee Qazi PA ELECTRONICALLY SIGNED 01/11/2015 10:11

## 2015-04-29 NOTE — Consult Note (Signed)
Chief Complaint:  Subjective/Chief Complaint Pt has not had a BM since yesterday.  No abdominal pain, nausea or vomiting.  Tolerating diet well.   VITAL SIGNS/ANCILLARY NOTES: **Vital Signs.:   20-Apr-16 05:28  Vital Signs Type Routine  Temperature Temperature (F) 98.7  Temperature Source oral  Pulse Pulse 89  Respirations Respirations 20  Systolic BP Systolic BP 694  Diastolic BP (mmHg) Diastolic BP (mmHg) 90  Mean BP 112  Pulse Ox % Pulse Ox % 93  Pulse Ox Activity Level  At rest  Oxygen Delivery Room Air/ 21 %    07:13  Mean BP 129   Brief Assessment:  GEN well developed, well nourished, no acute distress, A/Ox3, significant other at bedside   Cardiac Regular   Gastrointestinal Normal   Gastrointestinal details normal Soft  Nontender  Nondistended  Bowel sounds normal  No rebound tenderness  No gaurding  No rigidity   EXTR negative cyanosis/clubbing, negative edema   Additional Physical Exam Skin: warm, dry, intact   Lab Results: Routine Chem:  20-Apr-16 07:06   Glucose, Serum  117 (65-99 NOTE: New Reference Range  03/06/15)  BUN  48 (6-20 NOTE: New Reference Range  03/06/15)  Creatinine (comp)  3.54 (0.61-1.24 NOTE: New Reference Range  03/06/15)  Sodium, Serum 139 (135-145 NOTE: New Reference Range  03/06/15)  Potassium, Serum 3.9 (3.5-5.1 NOTE: New Reference Range  03/06/15)  Chloride, Serum 108 (101-111 NOTE: New Reference Range  03/06/15)  CO2, Serum 24 (22-32 NOTE: New Reference Range  03/06/15)  Calcium (Total), Serum  8.6 (8.9-10.3 NOTE: New Reference Range  03/06/15)  Anion Gap 7  eGFR (African American)  20  eGFR (Non-African American)  17 (eGFR values <22m/min/1.73 m2 may be an indication of chronic kidney disease (CKD). Calculated eGFR is useful in patients with stable renal function. The eGFR calculation will not be reliable in acutely ill patients when serum creatinine is changing rapidly. It is not useful in patients on dialysis.  The eGFR calculation may not be applicable to patients at the low and high extremes of body sizes, pregnant women, and vegetarians.)  Routine Hem:  20-Apr-16 07:06   WBC (CBC) 4.5  RBC (CBC)  3.25  Hemoglobin (CBC)  10.1  Hematocrit (CBC)  30.1  Platelet Count (CBC) 180  MCV 93  MCH 31.0  MCHC 33.5  RDW 14.5  Neutrophil % 47.7  Lymphocyte % 37.6  Monocyte % 11.7  Eosinophil % 2.4  Basophil % 0.6  Neutrophil # 2.2  Lymphocyte # 1.7  Monocyte # 0.5  Basophil # 0.0 (Result(s) reported on 18 Apr 2015 at 07:52AM.)   Assessment/Plan:  Assessment/Plan:  Assessment Melena: Resolved.  EGD without source.  If melena returns, would repeat EGD +/- SB capsule study Anemia: Secondary to #1.  Hgb stable. I have discussed his care [facility]& our plan of care is below.   Plan 1) Continue PPI daily 2) Monitor for recurrent bleeding Please call if you have any questions or concerns   Electronic Signatures: JAndria Meuse(NP)  (Signed 20-Apr-16 09:10)  Authored: Chief Complaint, VITAL SIGNS/ANCILLARY NOTES, Brief Assessment, Lab Results, Assessment/Plan   Last Updated: 20-Apr-16 09:10 by JAndria Meuse(NP)

## 2015-04-29 NOTE — Consult Note (Signed)
Brief Consult Note: Diagnosis: Melena. The patient reports melena for the last 3 days with abd pain in the lower abd yesterday.   Patient was seen by consultant.   Consult note dictated.   Comments: The patient will be set up for an EGD for today. Discussed the paln with the patient and he agrees.  Electronic Signatures: Midge MiniumWohl, Lenor Provencher (MD)  (Signed 19-Apr-16 14:37)  Authored: Brief Consult Note   Last Updated: 19-Apr-16 14:37 by Midge MiniumWohl, Amanii Snethen (MD)

## 2015-04-29 NOTE — Consult Note (Signed)
PATIENT NAME:  Austin Haney, Austin Haney MR#:  [id number] DATE OF BIRTH:  1951-11-17  DATE OF CONSULTATION:  04/17/2015  REFERRING PHYSICIAN:   CONSULTING PHYSICIAN:  Midge Minium, MD  CONSULTING SERVICE: Gastroenterology.   REASON FOR CONSULTATION: Black, tarry stools.   HISTORY OF PRESENT ILLNESS: This patient is a 64 year old gentleman who comes in with black stools for the last 4 days. The patient states that he had some abdominal pain yesterday. The patient also reports that he has not had any anti-inflammatory medications recently, he does take [facility] sometimes, but has not had this recently. The patient denies any vomiting blood and he denies any bright red blood in his stools. He reports eating a chicken sandwich for breakfast this morning with some carbonated soda. I am now being asked to see the patient for his epigastric pain that was yesterday and his black, tarry stools.   PAST MEDICAL HISTORY: Hypertension, diabetes, nephrolithiasis, peptic ulcer disease, chronic kidney disease, GERD, chronic back pain, diastolic dysfunction, EGD and colonoscopy by Dr. Marva Panda in 2011 with upper endoscopy showing chronic gastritis   PAST SURGICAL HISTORY: Right arm fracture with hardware placement to repair the fracture.   SOCIAL HISTORY: The patient smokes half pack of cigarettes a day. Occasional marijuana use. Does not drink alcohol. Lives with his girlfriend.   FAMILY HISTORY: Noncontributory.   REVIEW OF SYSTEMS:  Complete review of systems was reviewed and is negative except what was stated above.   HOME MEDICATIONS:  1. Verapamil.  2. Tradjenta.  3. Omeprazole.  4. Narco.  5. Multivitamin.   6. Minoxidil.   7. Loratadine.   8. Lisinopril.   9. Labetalol.   10. Hydralazine.   11. Furosemide.   12. Clonidine.   13. Atorvastatin.   14. Atenolol.  15. Aspirin.   ALLERGIES: No known drug allergies.   PHYSICAL EXAMINATION:  GENERAL: The patient sitting up in bed, in no apparent distress.   VITAL SIGNS: Temperature is 97.6, pulse 76, respirations 20, blood pressure 180/101, pulse oximetry 95%.  HEENT: Normocephalic, atraumatic. Extraocular motor intact. Pupils equally round and reactive to light and accommodation. Without JVD. Swollen salivary gland.   LUNGS: Clear to auscultation bilaterally.  HEART: Regular rate and rhythm without murmurs, rubs, or gallops.  ABDOMEN: Soft, nontender, nondistended, without hepatosplenomegaly, without rebound, without guarding.  SKIN: Without any rashes, lesions, or ulcers. No open wound seen.  MUSCULOSKELETAL: Good strength bilaterally without any deformities.  NEUROLOGICAL: Grossly intact with nonfocal neurological exam.   PSYCHIATRIC: The patient is alert and orientated and of normal affect.   ANCILLARY SERVICES: Hemoglobin 10.9, hematocrit 33.0.   ASSESSMENT AND PLAN: This patient is a 64 year old gentleman who had black stools and some abdominal pain, states he had some peptic ulcer disease, the most recent esophagogastroduodenoscopy and colonoscopy showed some mild gastritis. The patient underwent an esophagogastroduodenoscopy today at the time shortly after being consulted and his esophagogastroduodenoscopy showed some retained food without any blood in the stomach or small bowel, there is no pathology such as an ulcer or recent or active bleeding seen. The patient may have had a site of bleeding 4-5 days ago which then resulted in black, tarry stools, but has healed by now. There is no old blood or fresh blood in the small bowel that was inspected with the upper endoscopy. The patient should be treated with a PPI and his hemoglobin should be followed.   Thank you very much for involving me in the care of this patient. If you have  any questions please do not hesitate to call.     ____________________________ Midge Miniumarren Chade Pitner, MD dw:bu D: 04/17/2015 19:38:23 ET T: 04/17/2015 20:40:52 ET JOB#: [id number]  cc: Midge Miniumarren Kriste Broman, MD, <Dictator> Midge MiniumARREN  Ardie Dragoo MD ELECTRONICALLY SIGNED 04/18/2015 8:48

## 2015-04-29 NOTE — H&P (Signed)
PATIENT NAME:  Austin Haney, DEARDEN MR#:  [id number] DATE OF BIRTH:  11-26-1951  DATE OF ADMISSION:  04/17/2015  PRIMARY CARE PHYSICIAN:  Dr. Ellsworth Lennox.    PRIMARY NEPHROLOGIST:  Dr. Thedore Mins.    CARDIOLOGIST: Dr. Gwen Pounds.   CHIEF COMPLAINT: Dark black tarry stool.   HISTORY OF PRESENT ILLNESS: This very pleasant 64 year old man with past medical history of gastroesophageal reflux disease and peptic ulcer disease, presents as a direct admission from his primary care office due to melena. He reports that he has had about 3 days of dark black tarry stools. He denies vomiting, syncope, or presyncope. He states that he had some abdominal pain yesterday, which was epigastric. He drank buttermilk which helped with the pain, but he has continued to have dark black stools. He has not seen any bright red blood in his stool. He states that he has a history of peptic ulcer disease, but does not recall if he has ever had an EGD in the past.    PAST MEDICAL HISTORY:  1. Hypertension.  2. Diabetes mellitus type 2, last A1c is 6.5.  3. Nephrolithiasis.  4. Peptic ulcer disease, unknown if he has ever had an EGD.  5. Chronic kidney disease followed by Dr. Thedore Mins.  6. Gastroesophageal reflux disease.  7. Chronic back pain.  8. Diastolic dysfunction seen on 2-D echocardiogram December 2015.    PAST SURGICAL HISTORY: He has had surgery on his right arm requiring hardware placement to repair a fracture.   SOCIAL HISTORY: Positive for a cigarette smoking of 1/2 pack per day for several decades. He occasionally smokes marijuana. Does not drink alcohol. He currently lives with his girlfriend. He does not use a cane or walker or supplemental oxygen.   FAMILY MEDICAL HISTORY: Positive for coronary artery disease in his father, hypertension in several family members. No family history of stroke.   REVIEW OF SYSTEMS:  CONSTITUTIONAL: Negative for fever, fatigue, weakness, or change in weight.  HEENT: No pain in the  eyes or ears. No change in hearing or vision. No sore throat or difficulty swallowing.  RESPIRATORY: No coughing, wheezing, hemoptysis, or shortness of breath.  CARDIOVASCULAR: No chest pain, orthopnea, edema, or syncope.  GASTROINTESTINAL: Positive as noted above for melena and midepigastric abdominal pain. No nausea, vomiting, diarrhea. No bright red blood.   GENITOURINARY:  No dysuria or frequency.  ENDOCRINE: No hot or cold intolerance, polyuria, or polydipsia.  HEMATOLOGIC: No easy bruising or bleeding.  SKIN: No new rashes, lesions, or changes.   MUSCULOSKELETAL: No new pain in the neck, back, shoulders, knees, or hips.  No history of gout.   NEUROLOGIC:  No focal numbness or weakness. No seizure or dementia. No headache.  PSYCHIATRIC: No bipolar disorder or schizophrenia.   HOME MEDICATIONS:  1. Verapamil 240 mg 1 capsule daily.  2. Tradjenta 5 mg 1 tablet daily.  3. Omeprazole 20 mg 1 tablet daily.  4. Norco 325 mg-10 mg 1 tablet twice a day as needed for pain.  5. Multivitamin 1 tablet daily.  6. Minoxidil 2.5 mg 2 tablets once a day.  7. Loratadine 10 mg 1 tablet once a day.  8. Lisinopril 40 mg 1 tablet once a day.  9. Labetalol 300 mg 1 tablet 3 times a day.  10. Hydralazine 100 mg 1 tablet 4 times a day.  11. Furosemide 20 mg 1 tablet 2 times a day.  12. Clonidine 0.3 mg 1 tablet 3 times a day.  13. Atorvastatin 20 mg 1  tablet once a day at bedtime.  14. Atenolol 100 mg twice a day.  15. Aspirin 81 mg 1 tablet daily.   ALLERGIES: No known allergies.   PHYSICAL EXAMINATION:  VITAL SIGNS: Temperature 98 degrees, pulse 78, respirations 20, blood pressure 177/100, oxygenation 98% on room air.  GENERAL: No acute distress.  HEENT: Pupils equal, round, and reactive to light. Conjunctivae clear. Extraocular motion intact. Oral mucous membranes are pink and moist. Posterior oropharynx is clear of exudate, edema, or erythema. He does have very swollen submandibular salivary  glands which are nontender and he states these are chronic.  NECK: Supple. Trachea is midline. Thyroid is nontender.  RESPIRATORY: Lungs are clear to auscultation bilaterally with good air movement.  CARDIOVASCULAR: Regular rate and rhythm. No murmurs, rubs, or gallops. No peripheral edema. Peripheral pulses 2 +. No bruit or JVD.  ABDOMEN: Soft, nontender, nondistended. Bowel sounds are normal. There is no guarding, rebound, hepatosplenomegaly, or mass noted.  SKIN: No rashes, ulcers, or lesions noted. No open wounds.  MUSCULOSKELETAL: Range of motion normal. No joint effusions. Strength 5 out of 5 throughout.  NEUROLOGIC: Cranial nerves II through XII grossly intact. Strength and sensation intact. Nonfocal.  PSYCHIATRIC: He is alert and oriented, has fair insight into his clinical condition. No signs of uncontrolled depression or anxiety.   LABORATORY DATA:  On admission sodium 141, potassium 4.1, chloride 105, bicarbonate 27, BUN 46, creatinine 3.7, glucose 102. Calcium is 8.9. LFTs are normal. White blood cells 5.0, hemoglobin 10.9, platelets 191,000. MCV is 93.   IMAGING: No imaging at this time.   ASSESSMENT AND PLAN:  1.  Melena and anemia in patient with history of peptic ulcer disease and gastroesophageal reflux disease: He does not currently have a gastroenterologist, he states that the last time he saw one was about 12 years ago. States the last time he had any melena or symptoms of peptic ulcer disease with about 7 years ago. He has no active bright red blood currently. He is pain-free. We will start b.i.d. PPIs and consult gastroenterology for possible EGD. He will be n.p.o. in anticipation of procedure. We will guaiac stools.  2.  Hypertension: On presentation blood pressure is elevated despite multiple antihypertensive medications on board. We will continue his home regimen and add IV hydralazine as needed for systolics greater than 180 or diastolic over 100. Uncontrolled hypertension  likely due to his renal disease. His creatinine is slightly elevated from his baseline which seems to be about 3.5. We will ask nephrology to follow as an outpatient, no acute nephrology needs at this time. Electrolytes are stable. If blood pressure remains uncontrolled would consider nephrology consultation at that time.  3.  Diabetes mellitus: We will start sliding scale insulin.   TIME SPENT ON ADMISSION: 40 minutes.    ____________________________ Ena Dawleyatherine P. Clent RidgesWalsh, MD cpw:bu D: 04/17/2015 14:35:41 ET T: 04/17/2015 15:07:13 ET JOB#: [id number]  cc: Santina Evansatherine P. Clent RidgesWalsh, MD, <Dictator> Gale JourneyATHERINE P WALSH MD ELECTRONICALLY SIGNED 04/18/2015 18:48

## 2015-04-29 NOTE — Consult Note (Signed)
Chief Complaint:  Subjective/Chief Complaint Pt has had a dark heme negative BM.  Denies abdominal pain, nausea or vomiting.  Tolerating diet well.   VITAL SIGNS/ANCILLARY NOTES: **Vital Signs.:   21-Apr-16 08:32  Vital Signs Type Routine  Temperature Temperature (F) 98.1  Temperature Source oral  Pulse Pulse 95  Respirations Respirations 18  Systolic BP Systolic BP 163  Diastolic BP (mmHg) Diastolic BP (mmHg) 97  Mean BP 119  Pulse Ox % Pulse Ox % 100  Pulse Ox Activity Level  At rest  Oxygen Delivery Room Air/ 21 %   Brief Assessment:  GEN well developed, well nourished, no acute distress, A/Ox3, significant other at bedside   Cardiac Regular   Gastrointestinal Normal   Gastrointestinal details normal Soft  Nontender  Nondistended  Bowel sounds normal  No rebound tenderness  No gaurding  No rigidity   EXTR negative cyanosis/clubbing, negative edema   Additional Physical Exam Skin: warm, dry, intact   Lab Results: Routine Hem:  21-Apr-16 09:33   WBC (CBC) 4.6  RBC (CBC)  3.48  Hemoglobin (CBC)  10.6  Hematocrit (CBC)  32.5  Platelet Count (CBC) 210  MCV 93  MCH 30.3  MCHC 32.5  RDW  14.8  Neutrophil % 61.3  Lymphocyte % 26.4  Monocyte % 9.6  Eosinophil % 2.0  Basophil % 0.7  Neutrophil # 2.8  Lymphocyte # 1.2  Monocyte # 0.4  Eosinophil # 0.1  Basophil # 0.0 (Result(s) reported on 19 Apr 2015 at 09:51AM.)   Assessment/Plan:  Assessment/Plan:  Assessment Melena: Resolved.  outpatient followup Anemia: Secondary to #1.  Hgb stable. I [facility] his care [facility] & our plan of care is below.   Plan 1) Continue PPI daily 2) Monitor for recurrent bleeding & call us immediately or go to ER 3) Follow up with us in 2 weeks, sooner if needed Please call if you have any questions or concerns   Electronic Signatures: Joselyn ArrowJones, Glory Graefe L (NP)  (Signed 21-Apr-16 10:27)  Authored: Chief Complaint, VITAL SIGNS/ANCILLARY NOTES, Brief Assessment, Lab  Results, Assessment/Plan   Last Updated: 21-Apr-16 10:27 by Joselyn ArrowJones, Reona Zendejas L (NP)

## 2015-05-01 NOTE — Discharge Summary (Signed)
Dates of Admission and Diagnosis:  Date of Admission 17-Apr-2015   Date of Discharge 19-Apr-2015   Admitting Diagnosis Malena   Final Diagnosis Malena;obscure GI bleed;negative EGD ckd stage 2 htn    Chief Complaint/History of Present Illness Admitted for malena/ hb was stable,never required tranfusion.had abdominal pain earlier but not on admisison.admitted,started on iv fluids,Iv protonix. GI swa,and had EGD was done.EGD was not diagnostic,andpt needs capsule  endoscopy as an out opt d/ced home in stbale condition   Allergies:  No Known Allergies:   Hospital Course:  Condition on Discharge Fair   Code Status:  Code Status Full Code   DISCHARGE INSTRUCTIONS HOME MEDS:  Medication Reconciliation: Patient's Home Medications at Discharge:     Medication Instructions  aspir 81 oral enteric coated tablet  1  orally once a day    multi-vitamin  1 tab(s)  once a day    omeprazole 20 mg oral enteric coated tablet  1  orally once a day    clonidine 0.3 mg oral tablet  tab(s) orally 3 times a day   lisinopril 40 mg oral tablet  1 tab(s) orally once a day   verapamil 240 mg/24 hours oral capsule, extended release  1 cap(s) orally once a day   hydralazine 100 mg oral tablet  tab(s) orally 4 times a day   atorvastatin 20 mg oral tablet  1 tab(s) orally once a day (at bedtime)   labetalol 300 mg oral tablet   orally 3 times a day   atenolol 100 mg oral tablet   orally 2 times a day   tradjenta 5 mg oral tablet  1 tab(s) orally once a day   minoxidil 2.5 mg oral tablet  2 tab(s) orally once a day   loratadine 10 mg oral tablet  1 tab(s) orally once a day   furosemide 20 mg oral tablet  1 tab(s) orally 2 times a day   norco 325 mg-10 mg oral tablet  1 tab(s) orally 2 times a day     Physician's Instructions:  Home Health? No   Diet Low Sodium  Low Fat, Low Cholesterol   Diet Consistency Regular Consistency   Activity Limitations As tolerated   Time frame for Follow Up  Appointment 1-2 weeks   Other Comments time spent;more than 30 min   Electronic Signatures: Katha HammingKonidena, Gabrielle Wakeland (MD)  (Signed [phone]-May-16 13:31)  Authored: ADMISSION DATE AND DIAGNOSIS, CHIEF COMPLAINT/HPI, Allergies, HOSPITAL COURSE, DISCHARGE INSTRUCTIONS HOME MEDS, PATIENT INSTRUCTIONS   Last Updated: 02-May-16 13:31 by Katha HammingKonidena, Simisola Sandles (MD)

## 2015-05-03 IMAGING — CR DG CHEST 1V PORT
1 series · 2 of 2 positions shown · non-contrast
Comparison: 01/28/2011

CLINICAL DATA: Shortness of breath, chest pain, hypertension

EXAM:
PORTABLE CHEST - 1 VIEW

[Series 1: ap · 0.17mm/px · 2 of 2 slices shown]
[im 1/2]
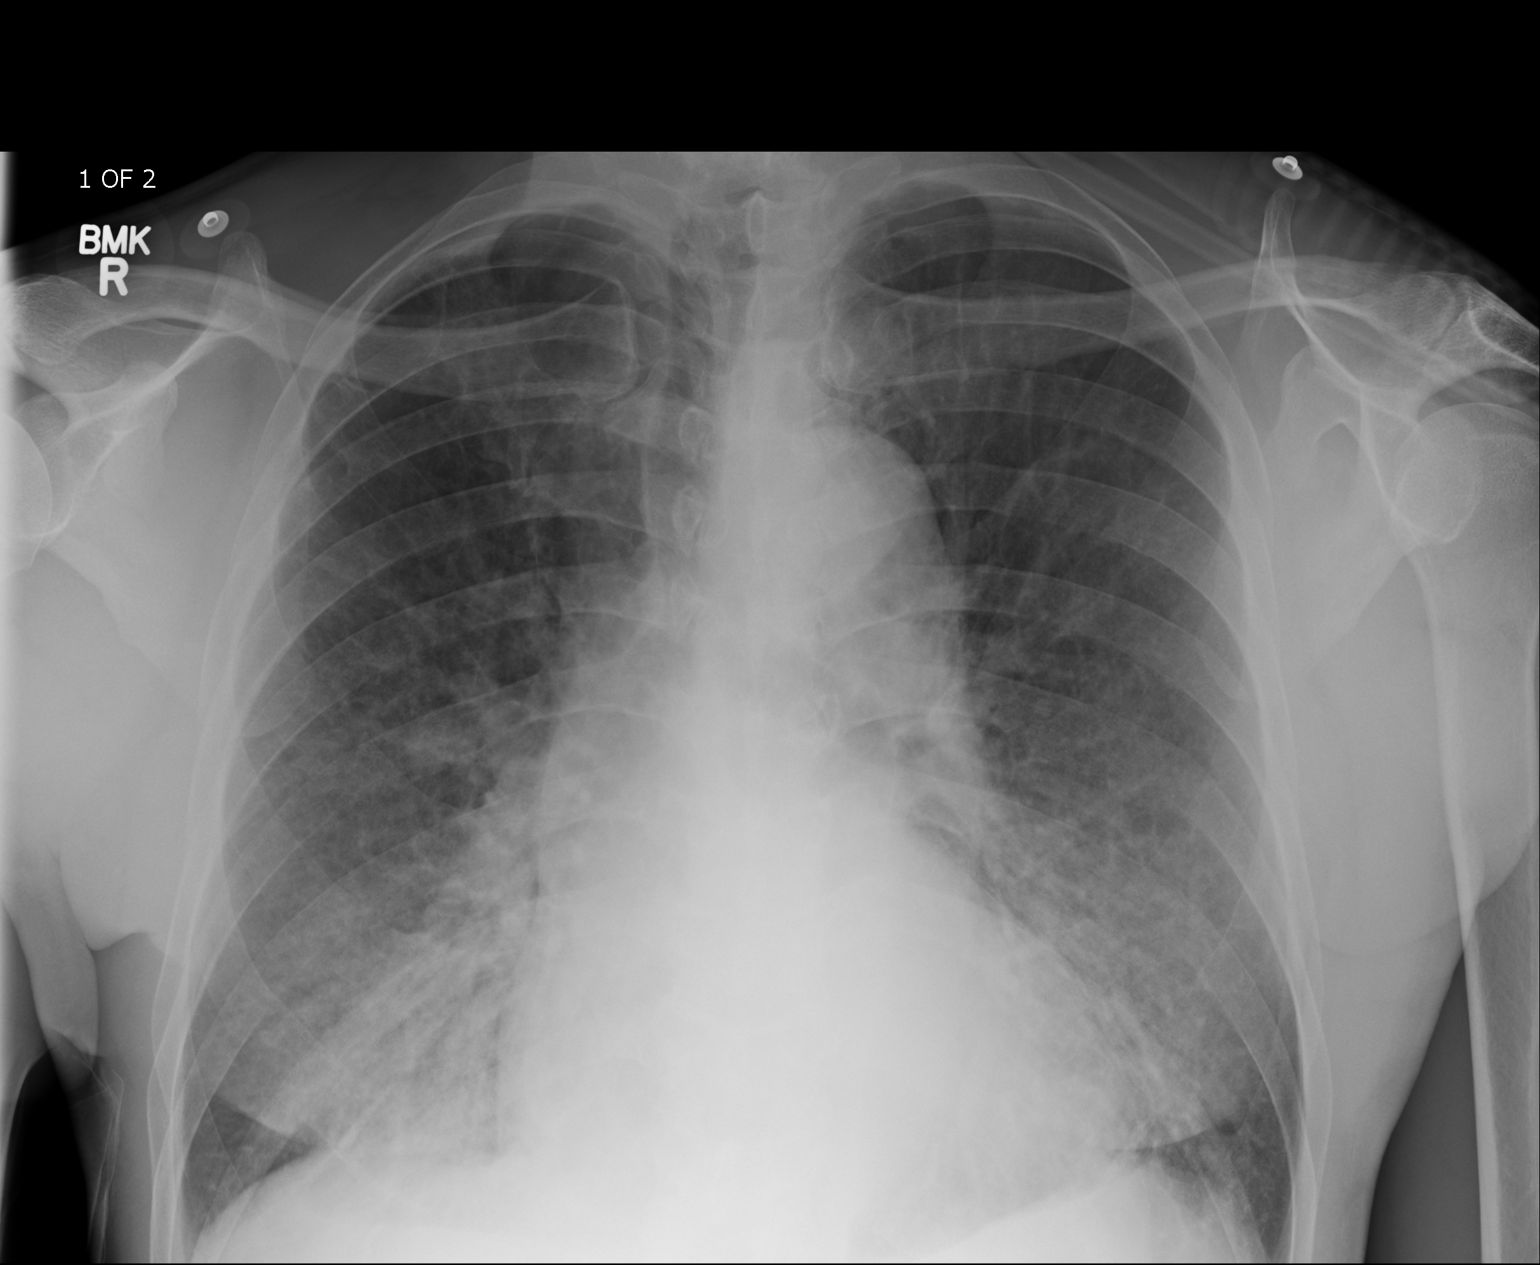
[im 2/2]
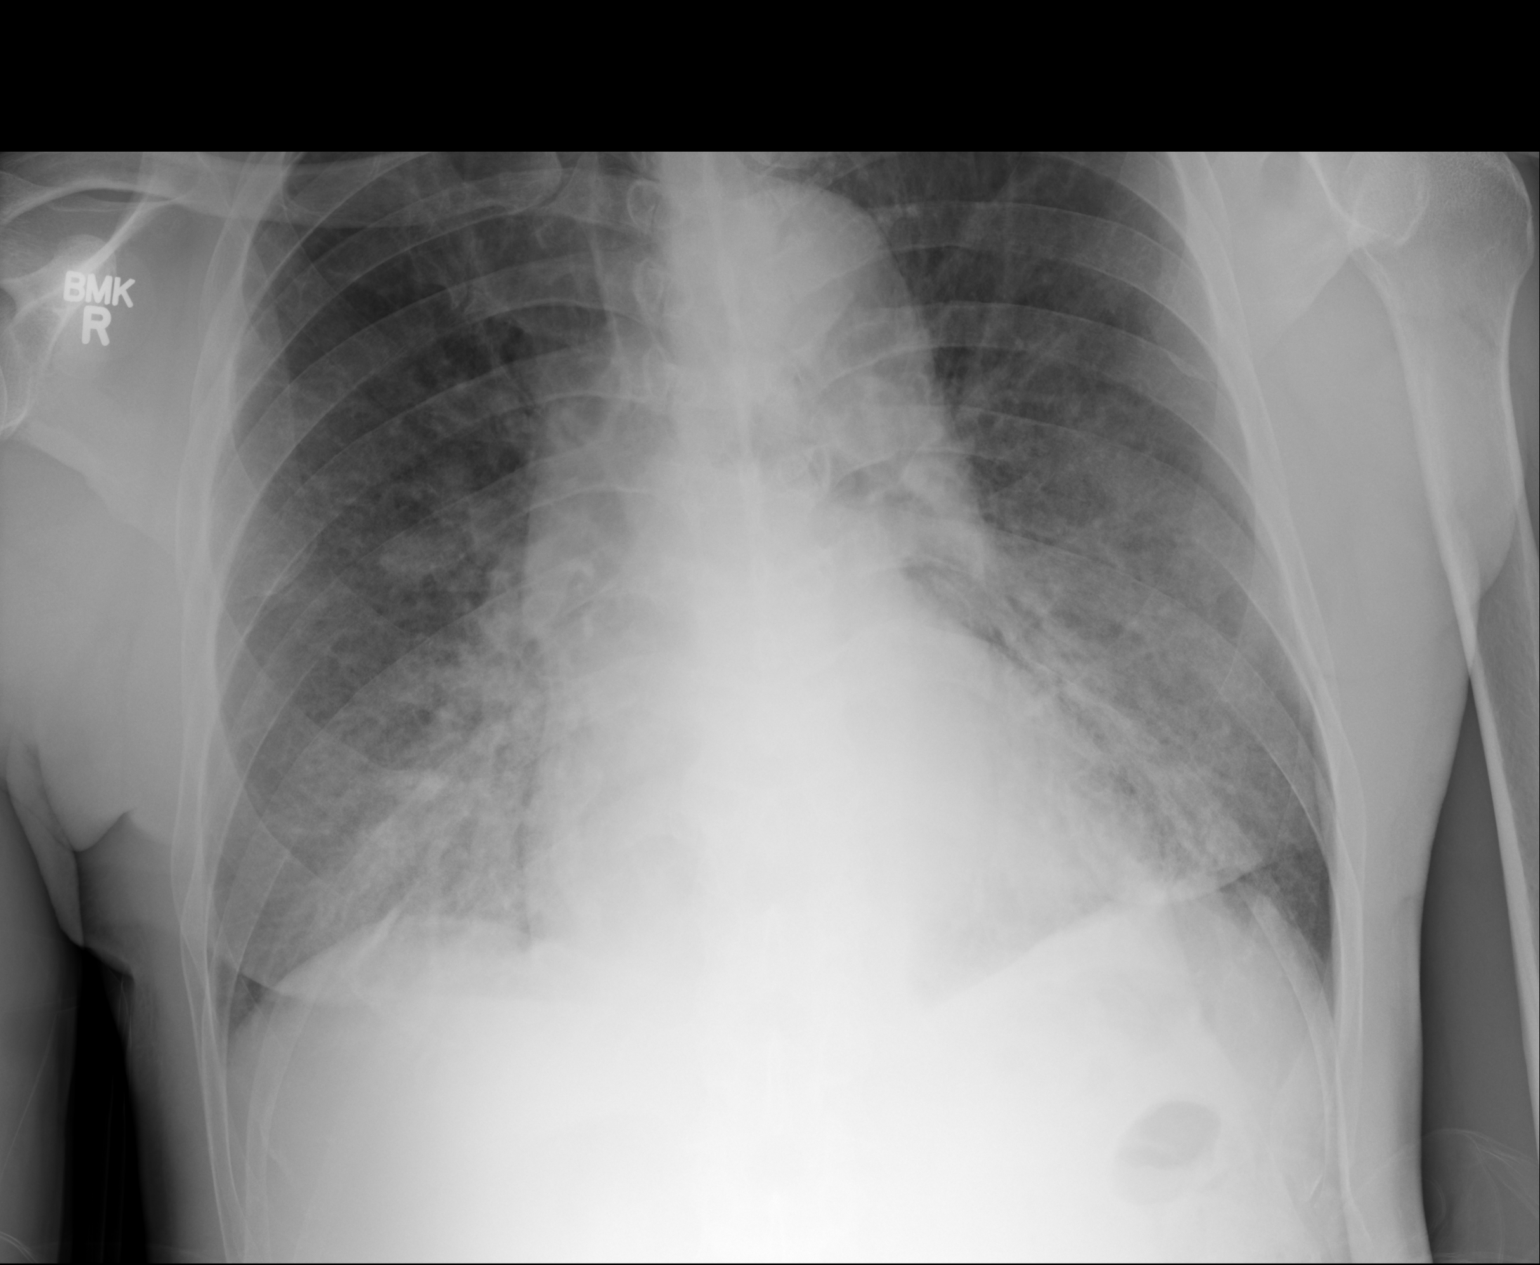

[2 of 2 positions shown; findings below may reference images not displayed]

FINDINGS: Patchy bilateral lower lobe opacities, right greater left,
suspicious for pneumonia.

No pleural effusion or pneumothorax.

Heart is top-normal in size.
IMPRESSION: Patchy bilateral lower lobe opacities, suspicious for pneumonia.

## 2015-05-30 DEATH — deceased
